# Patient Record
Sex: Male | Born: 1955 | Race: White | Hispanic: No | Marital: Single | Smoking: Former smoker
Health system: Southern US, Community
[De-identification: ages and names within clinical notes are randomized; demographics above are authoritative.]

---

## 2006-06-19 ENCOUNTER — Emergency Department (HOSPITAL_COMMUNITY): Admission: EM | Admit: 2006-06-19 | Discharge: 2006-06-19 | Payer: Self-pay | Admitting: *Deleted

## 2007-11-05 IMAGING — CT CT ABDOMEN W/O CM
1 of 2 series · 14 of 32 positions shown, 18 images · non-contrast
Comparison: None.

CLINICAL DATA: 50 year-old-male with right flank pain, hematuria.. 
 ABDOMEN CT WITHOUT CONTRAST (CT UROGRAM):
TECHNIQUE: Multidetector CT imaging of the abdomen was performed following the standard protocol without IV contrast.
TECHNIQUE: Multidetector CT imaging of the pelvis was performed following the standard protocol without IV contrast.

[Series 2: abd pelvis · axial · 0.88mm/px · z∈[-485,-35]mm · 14 of 100 slices shown, 18 images]
[im 5/100  soft-tissue]
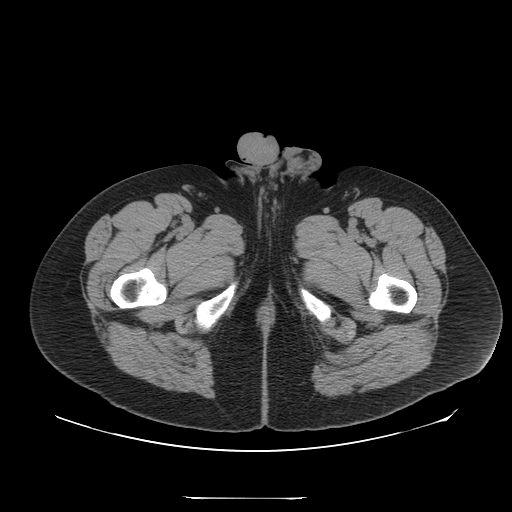
[im 5/100  bone]
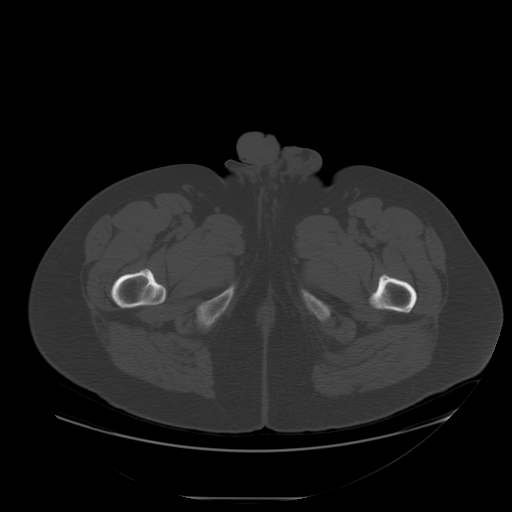
[im 13/100  soft-tissue]
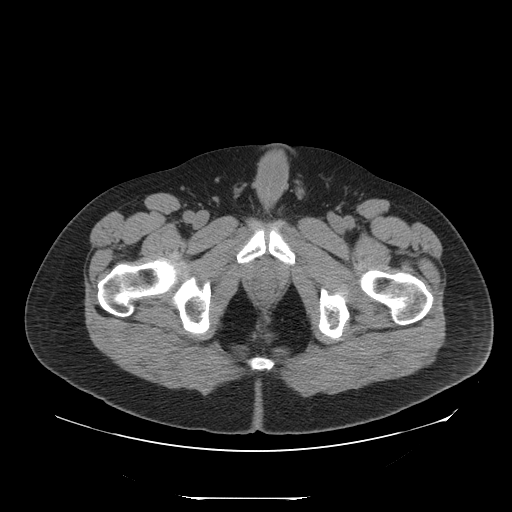
[im 21/100  soft-tissue]
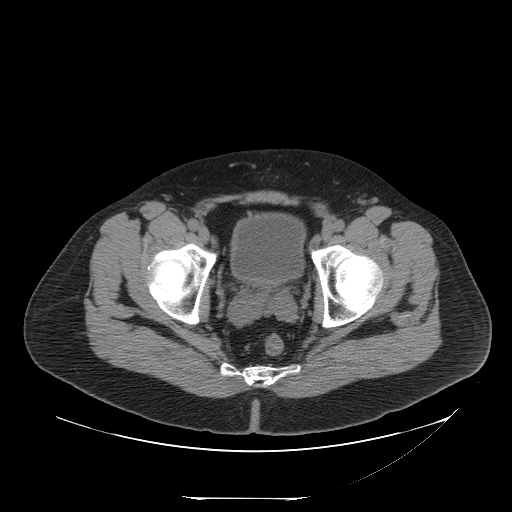
[im 29/100  soft-tissue]
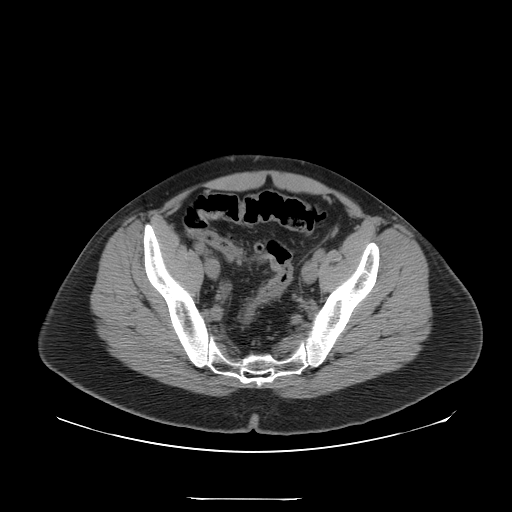
[im 38/100  soft-tissue]
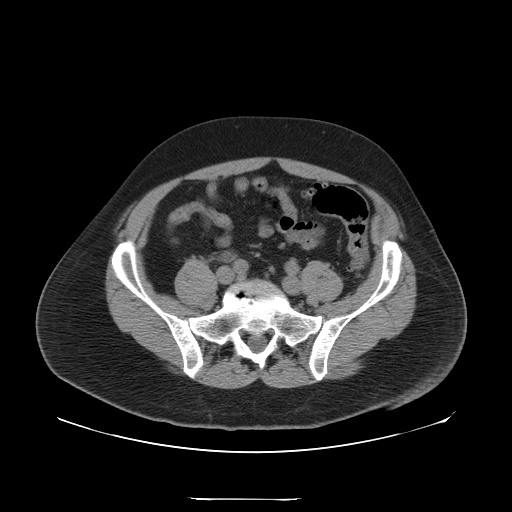
[im 46/100  soft-tissue]
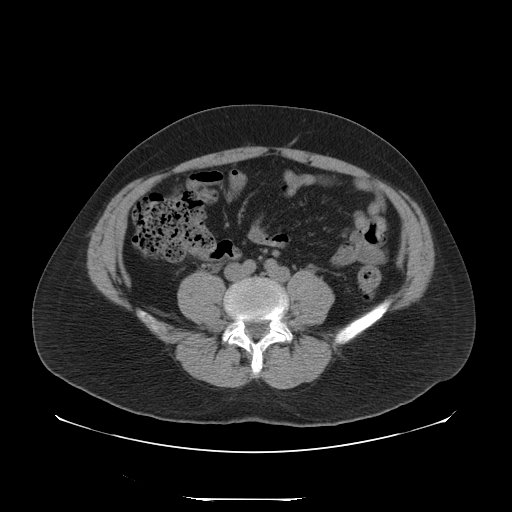
[im 54/100  soft-tissue]
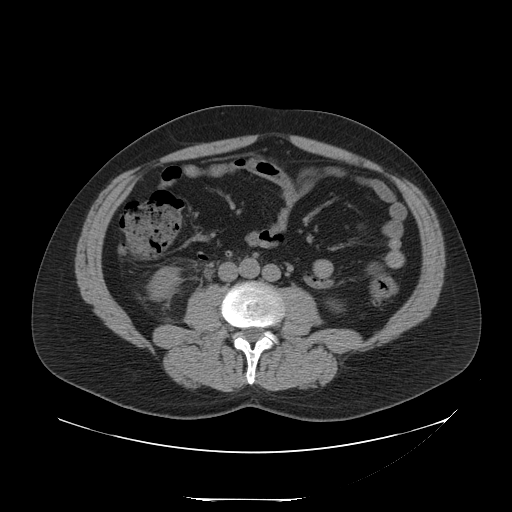
[im 62/100  soft-tissue]
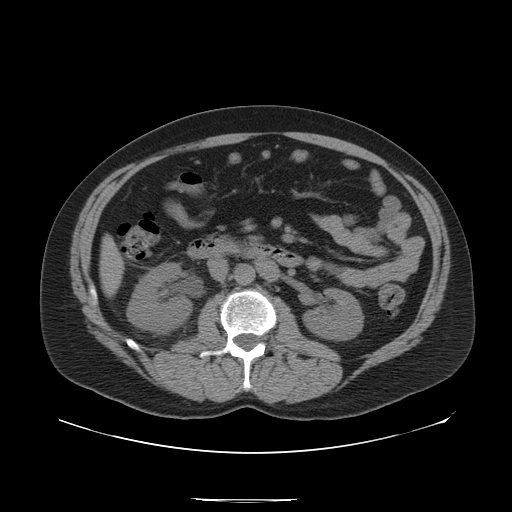
[im 71/100  soft-tissue]
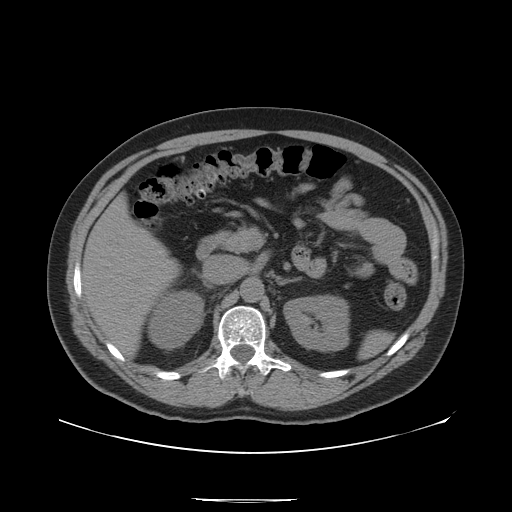
[im 71/100  bone]
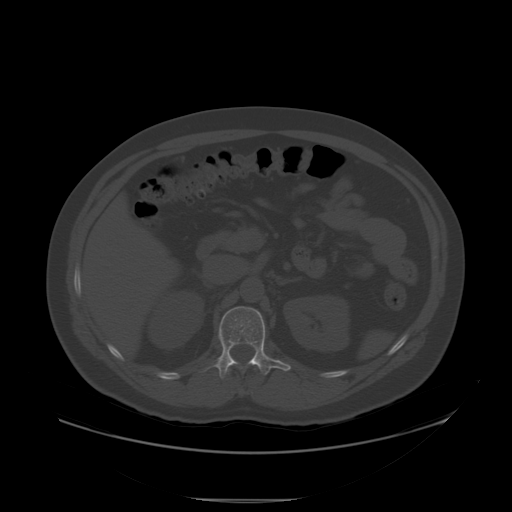
[im 79/100  soft-tissue]
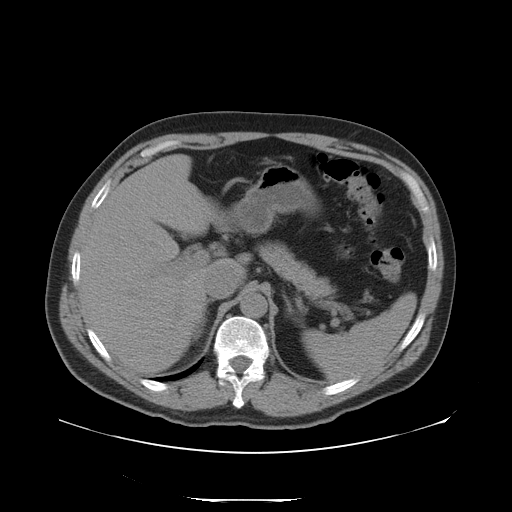
[im 83/100  lung]
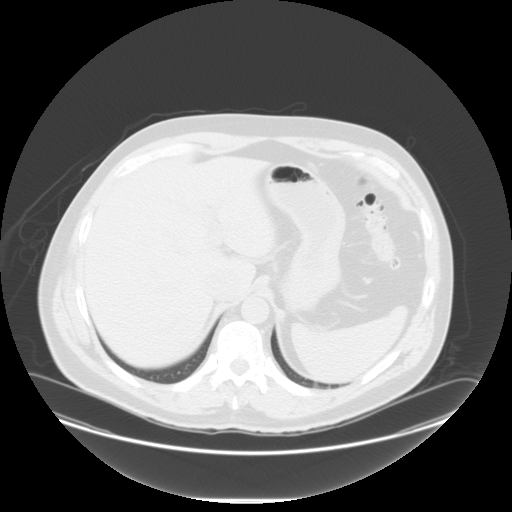
[im 87/100  soft-tissue]
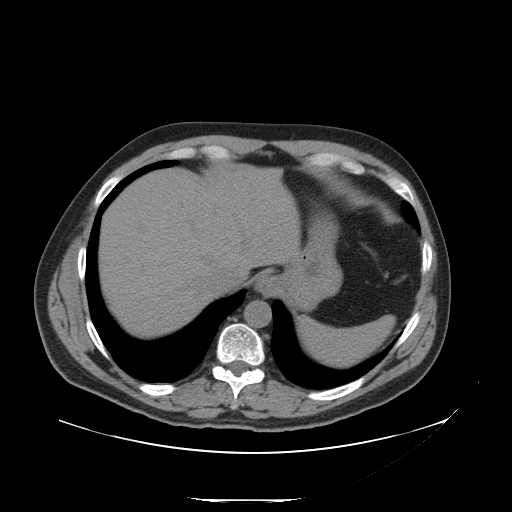
[im 87/100  lung]
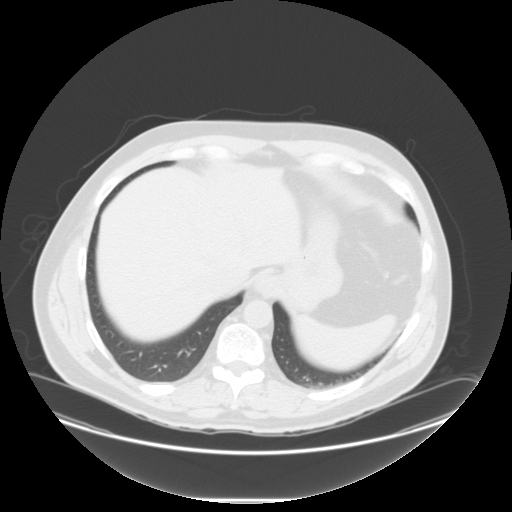
[im 91/100  lung]
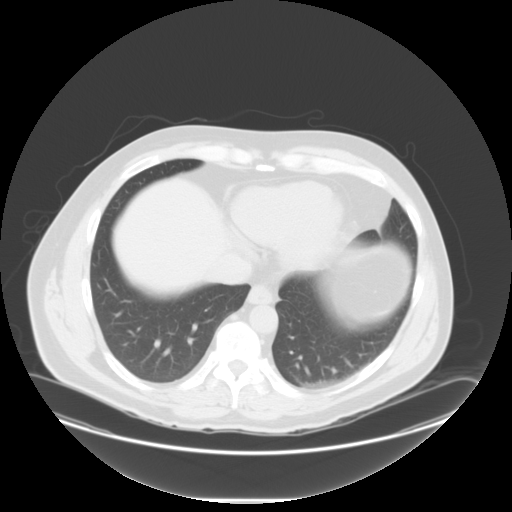
[im 95/100  soft-tissue]
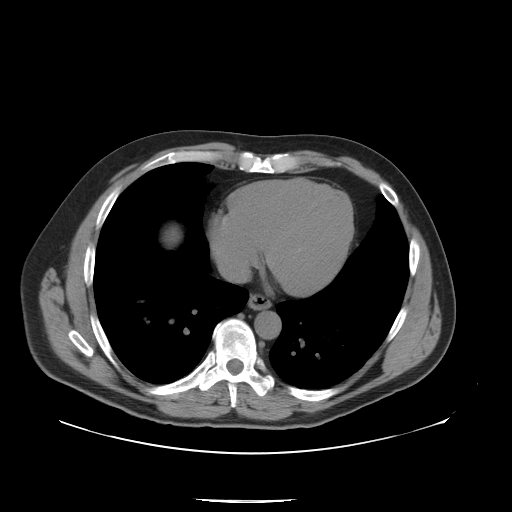
[im 95/100  lung]
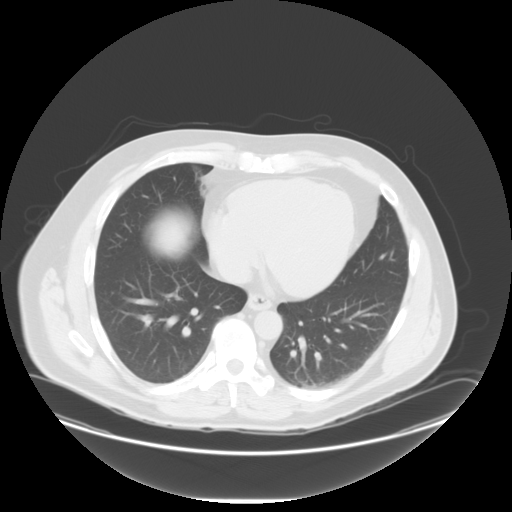

[14 of 32 positions shown; findings below may reference images not displayed]

FINDINGS: The lung bases are clear except for minimal dependent atelectasis. The heart size is within normal limits.  No pericardial effusion.  The lower esophagus and lower thoracic aorta are unremarkable.
 The unenhanced appearance of the liver and spleen are unremarkable.  The pancreas appears normal.  There is hydronephrosis of the right kidney and perinephric interstitial changes in the fluid consistent with marked high-grade obstruction.  There is also right hydroureter.  No upper ureteral calculi.  The left kidney is unremarkable.  The adrenal glands are normal.   Aorta is normal in caliber.  No mesenteric or retroperitoneal masses or adenopathy.  The gallbladder appears normal.  The stomach, duodenum, small bowel and colon demonstrate no significant findings.  There are scattered colonic diverticula.
IMPRESSION: 1.  Moderate to high-grade obstructive findings, right kidney.    No renal or upper ureteral calculi are seen.  
 2.  No other significant abdominal finding. 
 PELVIS CT WITHOUT CONTRAST:
FINDINGS: No ureteral calculi are seen.  The right ureter is dilated all the way down to the bladder.   The patient must have recently passed a stone from the right side.  I don?t see any definite bladder calculi.  The left ureter appears normal.   No pelvic masses, adenopathy, or free pelvic fluid collections.   The rectum, sigmoid colon and visualized small bowel loops are unremarkable, other than diverticulosis.  The seminal vesicles are slightly prominent, particularly on the right side.  The prostate gland is not enlarged.  The appendix is visualized and is normal.  No significant bony findings.
IMPRESSION: 1.  No obstructing ureteral calculi and no bladder calculi.   The patient must have recently passed a stone from the right ureter.  
 2.  No other significant pelvic findings.

## 2018-09-10 ENCOUNTER — Emergency Department (HOSPITAL_COMMUNITY): Payer: 59

## 2018-09-10 ENCOUNTER — Encounter (HOSPITAL_COMMUNITY): Payer: Self-pay | Admitting: Emergency Medicine

## 2018-09-10 ENCOUNTER — Emergency Department (HOSPITAL_COMMUNITY)
Admission: EM | Admit: 2018-09-10 | Discharge: 2018-09-10 | Disposition: A | Discharge status: Home / Self Care | Payer: 59 | Attending: Emergency Medicine | Admitting: Emergency Medicine

## 2018-09-10 ENCOUNTER — Other Ambulatory Visit: Payer: Self-pay

## 2018-09-10 DIAGNOSIS — R0789 Other chest pain: Secondary | ICD-10-CM | POA: Diagnosis not present

## 2018-09-10 DIAGNOSIS — Z79899 Other long term (current) drug therapy: Secondary | ICD-10-CM | POA: Insufficient documentation

## 2018-09-10 DIAGNOSIS — R079 Chest pain, unspecified: Secondary | ICD-10-CM | POA: Diagnosis present

## 2018-09-10 DIAGNOSIS — Z87891 Personal history of nicotine dependence: Secondary | ICD-10-CM | POA: Insufficient documentation

## 2018-09-10 LAB — CBC
HCT: 48 % (ref 39.0–52.0)
Hemoglobin: 16.3 g/dL (ref 13.0–17.0)
MCH: 31.9 pg (ref 26.0–34.0)
MCHC: 34 g/dL (ref 30.0–36.0)
MCV: 93.9 fL (ref 80.0–100.0)
NRBC: 0 % (ref 0.0–0.2)
PLATELETS: 216 10*3/uL (ref 150–400)
RBC: 5.11 MIL/uL (ref 4.22–5.81)
RDW: 12.6 % (ref 11.5–15.5)
WBC: 10.6 10*3/uL — AB (ref 4.0–10.5)

## 2018-09-10 LAB — BASIC METABOLIC PANEL WITH GFR
Anion gap: 10 (ref 5–15)
BUN: 13 mg/dL (ref 6–20)
CO2: 25 mmol/L (ref 22–32)
Calcium: 9.7 mg/dL (ref 8.9–10.3)
Chloride: 105 mmol/L (ref 98–111)
Creatinine, Ser: 1.05 mg/dL (ref 0.61–1.24)
GFR calc Af Amer: >60 mL/min
GFR calc non Af Amer: >60 mL/min
Glucose, Bld: 125 mg/dL — ABNORMAL HIGH (ref 70–99)
Potassium: 5 mmol/L (ref 3.5–5.1)
Sodium: 140 mmol/L (ref 135–145)

## 2018-09-10 LAB — I-STAT TROPONIN, ED
Troponin i, poc: 0 ng/mL (ref 0.00–0.08)
Troponin i, poc: 0.01 ng/mL (ref 0.00–0.08)
Troponin i, poc: 0.01 ng/mL (ref 0.00–0.08)

## 2018-09-10 LAB — D-DIMER, QUANTITATIVE: D-Dimer, Quant: 0.56 ug{FEU}/mL — ABNORMAL HIGH (ref 0.00–0.50)

## 2018-09-10 MED ORDER — NITROGLYCERIN 0.4 MG SL SUBL
0.4000 mg | SUBLINGUAL_TABLET | Freq: Once | SUBLINGUAL | Status: AC
  Administered 2018-09-10: 0.4 mg via SUBLINGUAL
  Filled 2018-09-10 (×2): qty 1

## 2018-09-10 MED ORDER — ACETAMINOPHEN 325 MG PO TABS: 650.0000 mg | ORAL_TABLET | Freq: Four times a day (QID) | ORAL | 0 refills | Status: DC | PRN

## 2018-09-10 MED ORDER — KETOROLAC TROMETHAMINE 30 MG/ML IJ SOLN
30.0000 mg | Freq: Once | INTRAMUSCULAR | Status: AC
  Administered 2018-09-10: 30 mg via INTRAVENOUS
  Filled 2018-09-10: qty 1

## 2018-09-10 MED ORDER — SODIUM CHLORIDE 0.9% FLUSH
3.0000 mL | Freq: Once | INTRAVENOUS | Status: AC
  Administered 2018-09-10: 3 mL via INTRAVENOUS

## 2018-09-10 NOTE — Discharge Instructions (Signed)
Please establish care with the cardiologist and primary care provider listed below. Return to ED for worsening symptoms, increased chest pain, leg swelling, vomiting or coughing up blood.

## 2018-09-10 NOTE — ED Provider Notes (Signed)
[facility] EMERGENCY DEPARTMENT Provider Note   CSN: [id number] Arrival date & time: 09/10/18  1126     History   Chief Complaint Chief Complaint  Patient presents with  . Chest Pain    HPI Raymond Schneider is a 63 y.o. male who presents to ED for constant left-sided chest pain for the past 10.5 hours.  States that the pain woke him up from his sleep.  Is located on the left lower chest and will sometimes radiate to the mid chest.  He was seen at urgent care, given aspirin and GI cocktail with no improvement in his symptoms.  No history of similar symptoms in the past.  He reports associated nausea but denies any shortness of breath, diaphoresis, URI symptoms, fever, leg swelling.  He has not seen a cardiologist in the past.  He denies any recent immobilization, history of PE, DVT, MI, abdominal pain, vomiting, alcohol, tobacco or other drug use.  HPI  History reviewed. No pertinent past medical history.  There are no active problems to display for this patient.   History reviewed. No pertinent surgical history.      Home Medications    Prior to Admission medications   Medication Sig Start Date End Date Taking? Authorizing Provider  Aspirin-Acetaminophen-Caffeine (GOODY HEADACHE PO) Take 1 packet by mouth daily as needed.   Yes [provider]    Family History No family history on file.  Social History Social History   Tobacco Use  . Smoking status: Former Games developer  . Smokeless tobacco: Never Used  Substance Use Topics  . Alcohol use: Not Currently  . Drug use: Not Currently     Allergies   Patient has no known allergies.   Review of Systems Review of Systems  Constitutional: Negative for appetite change, chills and fever.  HENT: Negative for ear pain, rhinorrhea, sneezing and sore throat.   Eyes: Negative for photophobia and visual disturbance.  Respiratory: Negative for cough, chest tightness, shortness of breath and wheezing.     Cardiovascular: Positive for chest pain. Negative for palpitations.  Gastrointestinal: Positive for nausea. Negative for abdominal pain, blood in stool, constipation, diarrhea and vomiting.  Genitourinary: Negative for dysuria, hematuria and urgency.  Musculoskeletal: Negative for myalgias.  Skin: Negative for rash.  Neurological: Negative for dizziness, weakness and light-headedness.     Physical Exam Updated Vital Signs BP 114/82   Pulse 61   Temp 97.7 F (36.5 C) (Oral)   Resp 17   SpO2 97%   Physical Exam Vitals signs and nursing note reviewed.  Constitutional:      General: He is not in acute distress.    Appearance: He is well-developed.  HENT:     Head: Normocephalic and atraumatic.     Nose: Nose normal.  Eyes:     General: No scleral icterus.       Left eye: No discharge.     Conjunctiva/sclera: Conjunctivae normal.  Neck:     Musculoskeletal: Normal range of motion and neck supple.  Cardiovascular:     Rate and Rhythm: Normal rate and regular rhythm.     Heart sounds: Normal heart sounds. No murmur. No friction rub. No gallop.   Pulmonary:     Effort: Pulmonary effort is normal. No respiratory distress.     Breath sounds: Normal breath sounds.  Abdominal:     General: Bowel sounds are normal. There is no distension.     Palpations: Abdomen is soft.  Tenderness: There is no abdominal tenderness. There is no guarding.  Musculoskeletal: Normal range of motion.     Comments: No lower extremity edema, erythema or calf tenderness bilaterally.  Skin:    General: Skin is warm and dry.     Findings: No rash.  Neurological:     Mental Status: He is alert.     Motor: No abnormal muscle tone.     Coordination: Coordination normal.      ED Treatments / Results  Labs (all labs ordered are listed, but only abnormal results are displayed) Labs Reviewed  BASIC METABOLIC PANEL - Abnormal; Notable for the following components:      Result Value   Glucose, Bld  125 (*)    All other components within normal limits  CBC - Abnormal; Notable for the following components:   WBC 10.6 (*)    All other components within normal limits  D-DIMER, QUANTITATIVE (NOT AT [facility]) - Abnormal; Notable for the following components:   D-Dimer, Quant 0.56 (*)    All other components within normal limits  I-STAT TROPONIN, ED  I-STAT TROPONIN, ED  I-STAT TROPONIN, ED    EKG EKG Interpretation  Date/Time:  Thursday September 10 2018 11:43:52 EST Ventricular Rate:  51 PR Interval:  144 QRS Duration: 88 QT Interval:  450 QTC Calculation: 414 R Axis:   6 Text Interpretation:  Sinus bradycardia Otherwise normal ECG Confirmed by Geoffery Lyons ([id number]) on 09/10/2018 12:28:45 PM   Radiology Dg Chest 2 View  Result Date: 09/10/2018 CLINICAL DATA:  Mid to left sided dull CP x this morning around 2 am, past smoker - quit 15 years ago EXAM: CHEST - 2 VIEW COMPARISON:  none FINDINGS: Lungs are clear. Mild cardiomegaly. No effusion. No pneumothorax. Mild compression deformity, lower thoracic spine approximately T11, age indeterminate. IMPRESSION: 1. Mild cardiomegaly. 2. Lower thoracic compression deformity, age indeterminate. Electronically Signed   By: Corlis Leak M.D.   On: 09/10/2018 13:37    Procedures Procedures (including critical care time)  Medications Ordered in ED Medications  sodium chloride flush (NS) 0.9 % injection 3 mL (3 mLs Intravenous Given 09/10/18 1313)  nitroGLYCERIN (NITROSTAT) SL tablet 0.4 mg (0.4 mg Sublingual Given 09/10/18 1328)  ketorolac (TORADOL) 30 MG/ML injection 30 mg (30 mg Intravenous Given 09/10/18 1457)     Initial Impression / Assessment and Plan / ED Course  I have reviewed the triage vital signs and the nursing notes.  Pertinent labs & imaging results that were available during my care of the patient were reviewed by me and considered in my medical decision making (see chart for details).  Clinical Course as of Sep 10 1740  Thu  Sep 10, 2018  1409 Age adjusted negative.  D-Dimer, Quant(!): 0.56 [HK]    Clinical Course User Index [HK] Dietrich Pates, PA-C    63 year old male with no significant past medical history presents to ED for chest pain that has been constant for the past 10-1/2 hours.  States that the pain woke him up from his sleep.  No improvement with aspirin or GI cocktail given by urgent care center.  He was sent to the ED for further evaluation.  He reports nausea but denies any vomiting, shortness of breath, URI symptoms or abdominal pain. On my exam he is overall well appearing. No lower extremity edema, erythema or calf tenderness bilaterally that would concern me for a DVT.  Vital signs are within normal limits.  EKG shows sinus bradycardia.  Chest x-ray  is unremarkable but does show mild cardiomegaly. Lab work significant for negative initial troponin, CBC, BMP. D-dimer negative age adjusted.  Delta troponin is negative.  Patient given pain medication here with improvement in his symptoms.  I feel that it is reasonable for him to follow-up and establish care with cardiology.  He is low risk by HEART score and ruled out for PE with negative d-dimer.  Will advise him to return to ED for any severe worsening symptoms. Patient discussed with my attending, Dr. Particia NearingHaviland.  Patient is hemodynamically stable, in NAD, and able to ambulate in the ED. Evaluation does not show pathology that would require ongoing emergent intervention or inpatient treatment. I explained the diagnosis to the patient. Pain has been managed and has no complaints prior to discharge. Patient is comfortable with above plan and is stable for discharge at this time. All questions were answered prior to disposition. Strict return precautions for returning to the ED were discussed. Encouraged follow up with PCP.    Portions of this note were generated with Scientist, clinical (histocompatibility and immunogenetics)Dragon dictation software. Dictation errors may occur despite best attempts at  proofreading.  Final Clinical Impressions(s) / ED Diagnoses   Final diagnoses:  Chest wall pain    ED Discharge Orders         Ordered    acetaminophen (TYLENOL) 325 MG tablet  Every 6 hours PRN,   Status:  Discontinued     09/10/18 1648           Dietrich PatesKhatri, Leathia Farnell, PA-C 09/10/18 1742    Jacalyn LefevreHaviland, Julie, MD 09/15/18 1256

## 2018-09-10 NOTE — ED Triage Notes (Signed)
Pt being sent from an UC in [place] for chest pain. He received GI cocktail without relief. He reports chest pain worse on the left that spreads across the chest. Denies sob or cardiac hx.

## 2018-09-10 NOTE — ED Notes (Signed)
Patient verbalizes understanding of discharge instructions. Opportunity for questioning and answers were provided. Armband removed by staff, pt discharged from ED.  

## 2018-11-25 DEATH — deceased
